# Patient Record
Sex: Female | Born: 1985 | Hispanic: Yes | Marital: Single | Smoking: Never smoker
Health system: Southern US, Community
[De-identification: ages and names within clinical notes are randomized; demographics above are authoritative.]

---

## 2018-07-22 ENCOUNTER — Other Ambulatory Visit: Payer: Self-pay

## 2018-07-22 ENCOUNTER — Encounter: Payer: Self-pay | Admitting: Emergency Medicine

## 2018-07-22 ENCOUNTER — Ambulatory Visit
Admission: EM | Admit: 2018-07-22 | Discharge: 2018-07-22 | Disposition: A | Discharge status: Home / Self Care | Payer: Self-pay | Attending: Family Medicine | Admitting: Family Medicine

## 2018-07-22 DIAGNOSIS — R109 Unspecified abdominal pain: Secondary | ICD-10-CM

## 2018-07-22 DIAGNOSIS — R350 Frequency of micturition: Secondary | ICD-10-CM

## 2018-07-22 DIAGNOSIS — R1011 Right upper quadrant pain: Secondary | ICD-10-CM

## 2018-07-22 DIAGNOSIS — R3915 Urgency of urination: Secondary | ICD-10-CM

## 2018-07-22 DIAGNOSIS — N12 Tubulo-interstitial nephritis, not specified as acute or chronic: Secondary | ICD-10-CM

## 2018-07-22 DIAGNOSIS — R1031 Right lower quadrant pain: Secondary | ICD-10-CM

## 2018-07-22 DIAGNOSIS — Z3202 Encounter for pregnancy test, result negative: Secondary | ICD-10-CM

## 2018-07-22 LAB — URINALYSIS, COMPLETE (UACMP) WITH MICROSCOPIC
Bilirubin Urine: NEGATIVE
GLUCOSE, UA: NEGATIVE mg/dL
KETONES UR: NEGATIVE mg/dL
Nitrite: NEGATIVE
PROTEIN: NEGATIVE mg/dL
Specific Gravity, Urine: 1.01 (ref 1.005–1.030)
WBC, UA: >50 WBC/hpf (ref 0–5)
pH: 6.5 (ref 5.0–8.0)

## 2018-07-22 LAB — CBC WITH DIFFERENTIAL/PLATELET
BASOS PCT: 0 %
Basophils Absolute: 0.1 10*3/uL (ref 0–0.1)
EOS ABS: 0 10*3/uL (ref 0–0.7)
EOS PCT: 0 %
HCT: 40.2 % (ref 35.0–47.0)
HEMOGLOBIN: 13.6 g/dL (ref 12.0–16.0)
Lymphocytes Relative: 8 %
Lymphs Abs: 1.8 10*3/uL (ref 1.0–3.6)
MCH: 29.9 pg (ref 26.0–34.0)
MCHC: 33.9 g/dL (ref 32.0–36.0)
MCV: 88 fL (ref 80.0–100.0)
MONO ABS: 1.2 10*3/uL — AB (ref 0.2–0.9)
MONOS PCT: 6 %
NEUTROS PCT: 86 %
Neutro Abs: 19.2 10*3/uL — ABNORMAL HIGH (ref 1.4–6.5)
Platelets: 319 10*3/uL (ref 150–440)
RBC: 4.56 MIL/uL (ref 3.80–5.20)
RDW: 12.6 % (ref 11.5–14.5)
WBC: 22.3 10*3/uL — ABNORMAL HIGH (ref 3.6–11.0)

## 2018-07-22 LAB — BASIC METABOLIC PANEL
Anion gap: 9 (ref 5–15)
BUN: 14 mg/dL (ref 6–20)
CALCIUM: 10.2 mg/dL (ref 8.9–10.3)
CO2: 21 mmol/L — ABNORMAL LOW (ref 22–32)
CREATININE: 0.86 mg/dL (ref 0.44–1.00)
Chloride: 103 mmol/L (ref 98–111)
GFR calc non Af Amer: >60 mL/min (ref >60)
Glucose, Bld: 128 mg/dL — ABNORMAL HIGH (ref 70–99)
Potassium: 3.3 mmol/L — ABNORMAL LOW (ref 3.5–5.1)
SODIUM: 133 mmol/L — AB (ref 135–145)

## 2018-07-22 LAB — PREGNANCY, URINE: Preg Test, Ur: NEGATIVE

## 2018-07-22 MED ORDER — CIPROFLOXACIN HCL 500 MG PO TABS: 500.0000 mg | ORAL_TABLET | Freq: Two times a day (BID) | ORAL | 0 refills | Status: AC

## 2018-07-22 MED ORDER — CEFTRIAXONE SODIUM 1 G IJ SOLR
1.0000 g | Freq: Once | INTRAMUSCULAR | Status: AC
  Administered 2018-07-22: 1 g via INTRAMUSCULAR

## 2018-07-22 NOTE — Discharge Instructions (Addendum)
Take medication as prescribed. Rest. Drink plenty of fluids.   Follow up with your primary care physician this week. Return to Urgent care as needed.  For any increased pain, inability to tolerate food or medicines or worsening concerns go directly to the emergency room as discussed.

## 2018-07-22 NOTE — ED Provider Notes (Signed)
[facility] ____________________________________________  Time seen: Approximately 9:30 PM  I have reviewed the triage vital signs and the nursing notes.   HISTORY  Chief Complaint Fever and Abdominal Pain  Language Barrier: Spanish. Interpreter offered, patient declined and opted to use friend at bedside.   HPI Doris Li is a 32 y.o. female presenting with friend at bedside for evaluation of 6 days of right flank pain that has gradually increased.  Reports 6 days of accompanying urinary frequency and some urinary urgency with darker appearing urine.  States since yesterday she has felt like she has developed a fever with accompanying chills and body aches.  Some nausea last night and one episode of diarrhea today.  No other changes in bowel habits.  No vomiting.  Has not eaten today but continues to drink fluids well.  Pain currently moderate.  Has been taken Tylenol and ibuprofen today with last taken a few hours ago.  Denies any vaginal complaints.  Also today with some front lower abdominal pain.  Denies history of previous abdominal issues, abdominal surgeries or urinary infections.  Denies concerns of current pregnancy.  Denies recent sickness.  Denies other aggravating or alleviating factors.  Reports otherwise feels well. Denies chest pain, shortness of breath, or rash. Denies recent sickness. Denies recent antibiotic use.   Patient's last menstrual period was 07/08/2018 (approximate).denies pregnancy.    History reviewed. No pertinent past medical history. Denies   There are no active problems to display for this patient.   History reviewed. No pertinent surgical history.   No current facility-administered medications for this encounter.   Current Outpatient Medications:  .  ciprofloxacin (CIPRO) 500 MG tablet, Take 1 tablet (500 mg total) by mouth 2 (two) times daily for 7 days., Disp: 14 tablet, Rfl: 0  Allergies Patient has no known allergies.  Family  History  Problem Relation Age of Onset  . Cancer Mother     Social History Social History   Tobacco Use  . Smoking status: Never Smoker  . Smokeless tobacco: Never Used  Substance Use Topics  . Alcohol use: Never    Frequency: Never  . Drug use: Never    Review of Systems Constitutional: positive fever. ENT: No sore throat. Cardiovascular: Denies chest pain. Respiratory: Denies shortness of breath. Gastrointestinal: as above.  Genitourinary: positive for dysuria. Musculoskeletal: Negative for back pain. Skin: Negative for rash.   ____________________________________________   PHYSICAL EXAM:  VITAL SIGNS: ED Triage Vitals  Enc Vitals Group     BP 07/22/18 2016 111/68     Pulse Rate 07/22/18 2016 (!) 103     Resp 07/22/18 2016 18     Temp 07/22/18 2016 99.6 F (37.6 C)     Temp Source 07/22/18 2016 Oral     SpO2 07/22/18 2016 98 %     Weight 07/22/18 2013 167 lb (75.8 kg)     Height 07/22/18 2013 5\' 5"  (1.651 m)     Head Circumference --      Peak Flow --      Pain Score 07/22/18 2012 10     Pain Loc --      Pain Edu? --      Excl. in GC? --    Vitals:   07/22/18 2013 07/22/18 2016 07/22/18 2103  BP:  111/68 111/68  Pulse:  (!) 103 94  Resp:  18 18  Temp:  99.6 F (37.6 C) 99.5 F (37.5 C)  TempSrc:  Oral Oral  SpO2:  98% 98%  Weight: 167 lb (75.8 kg)    Height: 5\' 5"  (1.651 m)       Constitutional: Alert and oriented.  Appears in mild distress but sitting still and calmly. Eyes: Conjunctivae are normal.  ENT      Head: Normocephalic and atraumatic.      Nose: No congestion/rhinnorhea.      Mouth/Throat: Mucous membranes are moist.Oropharynx non-erythematous.  No tonsillar swelling or exudate. Neck: No stridor. Supple without meningismus.  Hematological/Lymphatic/Immunilogical: No cervical lymphadenopathy. Cardiovascular: Normal rate, regular rhythm. Grossly normal heart sounds.  Good peripheral circulation. Respiratory: Normal respiratory  effort without tachypnea nor retractions. Breath sounds are clear and equal bilaterally. No wheezes, rales, rhonchi. Gastrointestinal: Normal bowel sounds.  No distention.  Moderate right CVA tenderness, no left CVA tenderness.  Mild to moderate right upper and right lower quadrant abdominal tenderness, abdomen otherwise soft and nontender. Musculoskeletal:  No midline cervical, thoracic or lumbar tenderness to palpation.  Neurologic:  Normal speech and language. Speech is normal. No gait instability.  Skin:  Skin is warm, dry and intact. No rash noted. Psychiatric: Mood and affect are normal. Speech and behavior are normal. Patient exhibits appropriate insight and judgment   ___________________________________________   LABS (all labs ordered are listed, but only abnormal results are displayed)  Labs Reviewed  URINALYSIS, COMPLETE (UACMP) WITH MICROSCOPIC - Abnormal; Notable for the following components:      Result Value   APPearance CLOUDY (*)    Hgb urine dipstick TRACE (*)    Leukocytes, UA MODERATE (*)    Bacteria, UA MANY (*)    All other components within normal limits  CBC WITH DIFFERENTIAL/PLATELET - Abnormal; Notable for the following components:   WBC 22.3 (*)    Neutro Abs 19.2 (*)    Monocytes Absolute 1.2 (*)    All other components within normal limits  BASIC METABOLIC PANEL - Abnormal; Notable for the following components:   Sodium 133 (*)    Potassium 3.3 (*)    CO2 21 (*)    Glucose, Bld 128 (*)    All other components within normal limits  URINE CULTURE  PREGNANCY, URINE   ____________________________________________  RADIOLOGY  No results found. ____________________________________________   PROCEDURES Procedures     INITIAL IMPRESSION / ASSESSMENT AND PLAN / ED COURSE  Pertinent labs & imaging results that were available during my care of the patient were reviewed by me and considered in my medical decision making (see chart for  details).  Patient presenting for evaluation of right flank pain and dysuria that continually increased over the last 6 days.  Patient does have moderate right CVA tenderness and mild to moderate right-sided abdominal tenderness with noted temperature.  Discussed multiple differentials with patient including appendicitis, urinary tract infection, pyelonephritis and ovarian source.  Urinalysis as above, suspect pyelonephritis.  Patient continues to tolerate food and fluids.  Labs reviewed and discussed in detail with patient.  Also discussed potassium 3.3 offered replacement, patient denies and states that she will alter diet.  Discussed current treatment including ER evaluation to differentiate between pyelonephritis and appendicitis, patient declined going to ER at this time and prefers outpatient initial treatment.  1 g IM Rocephin given in urgent care.  Will treat with 1 week of Cipro.  Encourage 2 to 3-day PCP follow-up.  Discussed for any increase of pain, difficulty controlling fever or inability to tolerate food or fluid or medication or worsening concerns go directly to the ER, and patient agreed  to this plan.Discussed indication, risks and benefits of medications with patient.  Discussed follow up and return parameters including no resolution or any worsening concerns. Patient verbalized understanding and agreed to plan.   ____________________________________________   FINAL CLINICAL IMPRESSION(S) / ED DIAGNOSES  Final diagnoses:  Pyelonephritis  Right flank pain     ED Discharge Orders         Ordered    ciprofloxacin (CIPRO) 500 MG tablet  2 times daily     07/22/18 2115           Note: This dictation was prepared with Dragon dictation along with smaller phrase technology. Any transcriptional errors that result from this process are unintentional.         Renford Dills, NP 07/22/18 2140

## 2018-07-22 NOTE — ED Triage Notes (Signed)
Patient c/o fever that started yesterday and right side abdominal pain that started 1 week ago.

## 2018-07-25 LAB — URINE CULTURE: Culture: >=100000 — AB

## 2018-08-20 ENCOUNTER — Other Ambulatory Visit: Payer: Self-pay

## 2018-08-20 ENCOUNTER — Encounter: Payer: Self-pay | Admitting: Emergency Medicine

## 2018-08-20 ENCOUNTER — Ambulatory Visit (INDEPENDENT_AMBULATORY_CARE_PROVIDER_SITE_OTHER): Payer: Self-pay

## 2018-08-20 ENCOUNTER — Ambulatory Visit
Admission: EM | Admit: 2018-08-20 | Discharge: 2018-08-20 | Disposition: A | Discharge status: Home / Self Care | Payer: Self-pay | Attending: Emergency Medicine | Admitting: Emergency Medicine

## 2018-08-20 ENCOUNTER — Emergency Department
Admission: EM | Admit: 2018-08-20 | Discharge: 2018-08-20 | Disposition: A | Discharge status: Home / Self Care | Payer: Self-pay | Attending: Emergency Medicine | Admitting: Emergency Medicine

## 2018-08-20 DIAGNOSIS — Z3202 Encounter for pregnancy test, result negative: Secondary | ICD-10-CM

## 2018-08-20 DIAGNOSIS — N201 Calculus of ureter: Secondary | ICD-10-CM

## 2018-08-20 DIAGNOSIS — N132 Hydronephrosis with renal and ureteral calculous obstruction: Secondary | ICD-10-CM

## 2018-08-20 DIAGNOSIS — N23 Unspecified renal colic: Secondary | ICD-10-CM | POA: Insufficient documentation

## 2018-08-20 LAB — URINALYSIS, COMPLETE (UACMP) WITH MICROSCOPIC
BILIRUBIN URINE: NEGATIVE
GLUCOSE, UA: NEGATIVE mg/dL
Ketones, ur: NEGATIVE mg/dL
NITRITE: NEGATIVE
Protein, ur: NEGATIVE mg/dL
Specific Gravity, Urine: 1.015 (ref 1.005–1.030)
pH: 6 (ref 5.0–8.0)

## 2018-08-20 LAB — CBC WITH DIFFERENTIAL/PLATELET
BASOS ABS: 0 10*3/uL (ref 0–0.1)
Basophils Relative: 0 %
EOS ABS: 0.1 10*3/uL (ref 0–0.7)
EOS PCT: 1 %
HCT: 37.6 % (ref 35.0–47.0)
HEMOGLOBIN: 12.9 g/dL (ref 12.0–16.0)
LYMPHS ABS: 2.4 10*3/uL (ref 1.0–3.6)
LYMPHS PCT: 25 %
MCH: 30 pg (ref 26.0–34.0)
MCHC: 34.4 g/dL (ref 32.0–36.0)
MCV: 87.2 fL (ref 80.0–100.0)
Monocytes Absolute: 0.8 10*3/uL (ref 0.2–0.9)
Monocytes Relative: 8 %
NEUTROS PCT: 66 %
Neutro Abs: 6.3 10*3/uL (ref 1.4–6.5)
PLATELETS: 277 10*3/uL (ref 150–440)
RBC: 4.31 MIL/uL (ref 3.80–5.20)
RDW: 13 % (ref 11.5–14.5)
WBC: 9.7 10*3/uL (ref 3.6–11.0)

## 2018-08-20 LAB — CBC
HEMATOCRIT: 37.5 % (ref 35.0–47.0)
HEMOGLOBIN: 13.4 g/dL (ref 12.0–16.0)
MCH: 30.9 pg (ref 26.0–34.0)
MCHC: 35.7 g/dL (ref 32.0–36.0)
MCV: 86.6 fL (ref 80.0–100.0)
Platelets: 288 10*3/uL (ref 150–440)
RBC: 4.34 MIL/uL (ref 3.80–5.20)
RDW: 13.1 % (ref 11.5–14.5)
WBC: 10.6 10*3/uL (ref 3.6–11.0)

## 2018-08-20 LAB — BASIC METABOLIC PANEL
ANION GAP: 7 (ref 5–15)
BUN: 18 mg/dL (ref 6–20)
CHLORIDE: 105 mmol/L (ref 98–111)
CO2: 23 mmol/L (ref 22–32)
Calcium: 9.7 mg/dL (ref 8.9–10.3)
Creatinine, Ser: 1.4 mg/dL — ABNORMAL HIGH (ref 0.44–1.00)
GFR, EST AFRICAN AMERICAN: 58 mL/min — AB (ref >60)
GFR, EST NON AFRICAN AMERICAN: 50 mL/min — AB (ref >60)
Glucose, Bld: 96 mg/dL (ref 70–99)
POTASSIUM: 3.5 mmol/L (ref 3.5–5.1)
SODIUM: 135 mmol/L (ref 135–145)

## 2018-08-20 LAB — PREGNANCY, URINE: PREG TEST UR: NEGATIVE

## 2018-08-20 MED ORDER — OXYCODONE-ACETAMINOPHEN 5-325 MG PO TABS: 1.0000 | ORAL_TABLET | ORAL | 0 refills | Status: AC | PRN

## 2018-08-20 MED ORDER — KETOROLAC TROMETHAMINE 60 MG/2ML IM SOLN
30.0000 mg | Freq: Once | INTRAMUSCULAR | Status: AC
  Administered 2018-08-20: 30 mg via INTRAMUSCULAR

## 2018-08-20 MED ORDER — ONDANSETRON HCL 4 MG/2ML IJ SOLN
4.0000 mg | Freq: Once | INTRAMUSCULAR | Status: AC
  Administered 2018-08-20: 4 mg via INTRAVENOUS
  Filled 2018-08-20: qty 2

## 2018-08-20 MED ORDER — HYDROMORPHONE HCL 1 MG/ML IJ SOLN
0.5000 mg | Freq: Once | INTRAMUSCULAR | Status: AC
  Administered 2018-08-20: 0.5 mg via INTRAVENOUS
  Filled 2018-08-20: qty 1

## 2018-08-20 NOTE — ED Provider Notes (Signed)
[facility] Emergency Department Provider Note   ____________________________________________   First MD Initiated Contact with Patient 08/20/18 1701     (approximate)  I have reviewed the triage vital signs and the nursing notes.   HISTORY  Chief Complaint Flank Pain    HPI Doris Li is a 32 y.o. female patient comes in complaining of right-sided flank pain rating down toward the groin.  She reports she is had this on the left before and it was a kidney stone.  Lab work and CT done in urgent care show a very large kidney stone in the distal ureter on the right side.  This explains her symptoms.  She is not running a fever.  She does not have white cells in her urine.  Pain is much better controlled by Toradol beginning to come back.  I discussed her with Dr. Sheppard Penton who is on-call for urology.  In view of her not having any pain he will follow her up in the office.  We will give her some Percocet for pain control.  She will return for any further problems.   History reviewed. No pertinent past medical history.  There are no active problems to display for this patient.   History reviewed. No pertinent surgical history.  Prior to Admission medications   Not on File    Allergies Patient has no known allergies.  Family History  Problem Relation Age of Onset  . Breast cancer Mother   . Healthy Father     Social History Social History   Tobacco Use  . Smoking status: Never Smoker  . Smokeless tobacco: Never Used  Substance Use Topics  . Alcohol use: Never    Frequency: Never  . Drug use: Never    Review of Systems  Constitutional: No fever/chills Eyes: No visual changes. ENT: No sore throat. Cardiovascular: Denies chest pain. Respiratory: Denies shortness of breath. Gastrointestinal: No abdominal pain at present.  No nausea, no vomiting.  No diarrhea.  No constipation. Genitourinary: Negative for dysuria at  present. Musculoskeletal: Negative for back pain. Skin: Negative for rash. Neurological: Negative for headaches, focal weakness  ____________________________________________   PHYSICAL EXAM:  VITAL SIGNS: ED Triage Vitals [08/20/18 1320]  Enc Vitals Group     BP 116/65     Pulse Rate 73     Resp 17     Temp 98.5 F (36.9 C)     Temp Source Oral     SpO2 98 %     Weight 164 lb (74.4 kg)     Height 5\' 5"  (1.651 m)     Head Circumference      Peak Flow      Pain Score 2     Pain Loc      Pain Edu?      Excl. in GC?     Constitutional: Alert and oriented. Well appearing and in no acute distress. Eyes: Conjunctivae are normal. PERRL. EOMI. Head: Atraumatic. Nose: No congestion/rhinnorhea. Mouth/Throat: Mucous membranes are moist.  Oropharynx non-erythematous. Neck: No stridor. Cardiovascular: Normal rate, regular rhythm. Grossly normal heart sounds.  Good peripheral circulation. Respiratory: Normal respiratory effort.  No retractions. Lungs CTAB. Gastrointestinal: Soft mild tenderness on the right side.  No distention. No abdominal bruits.  Some right CVA tenderness. Musculoskeletal: No lower extremity tenderness nor edema.  No joint effusions. Neurologic:  Normal speech and language. No gross focal neurologic deficits are appreciated. No gait instability. Skin:  Skin is warm, dry and intact.  No rash noted. Psychiatric: Mood and affect are normal. Speech and behavior are normal.  ____________________________________________   LABS (all labs ordered are listed, but only abnormal results are displayed)  Labs Reviewed  BASIC METABOLIC PANEL - Abnormal; Notable for the following components:      Result Value   Creatinine, Ser 1.40 (*)    GFR calc non Af Amer 50 (*)    GFR calc Af Amer 58 (*)    All other components within normal limits  CBC   ____________________________________________  EKG   ____________________________________________  RADIOLOGY   ED MD  interpretation: CT done at urgent care shows a very large right distal ureteral stone  Official radiology report(s): Ct Renal Stone Study  Result Date: 08/20/2018 CLINICAL DATA:  Right-sided flank pain for 1 day, initial encounter EXAM: CT ABDOMEN AND PELVIS WITHOUT CONTRAST TECHNIQUE: Multidetector CT imaging of the abdomen and pelvis was performed following the standard protocol without IV contrast. COMPARISON:  None. FINDINGS: Lower chest: No acute abnormality. Hepatobiliary: No focal liver abnormality is seen. No gallstones, gallbladder wall thickening, or biliary dilatation. Pancreas: Unremarkable. No pancreatic ductal dilatation or surrounding inflammatory changes. Spleen: Normal in size without focal abnormality. Adrenals/Urinary Tract: Adrenal glands are within normal limits. The left kidney demonstrates no renal calculi or obstructive changes. The bladder is decompressed. The right kidney demonstrates significant enlargement with massive hydronephrosis and proximal hydroureter. A few tiny nonobstructing right renal stones are noted. The obstructive changes are secondary to a large proximal right ureteral calculus measuring approximately 13 x 9 mm. The more distal right ureter is within normal limits. Stomach/Bowel: Stomach is within normal limits. Appendix appears normal. No evidence of bowel wall thickening, distention, or inflammatory changes. Vascular/Lymphatic: No significant vascular findings are present. No enlarged abdominal or pelvic lymph nodes. Reproductive: Uterus and bilateral adnexa are unremarkable. Other: No abdominal wall hernia or abnormality. No abdominopelvic ascites. Musculoskeletal: No acute or significant osseous findings. IMPRESSION: 13 x 9 mm obstructive stone within the proximal right ureter with significant hydronephrotic changes. Tiny nonobstructing renal stones are noted in the right kidney. No other focal abnormality is noted. These results were called by telephone at the  time of interpretation on 08/20/2018 at 12:08 pm to Dr. Domenick Gong , who verbally acknowledged these results. Electronically Signed   By: Alcide Clever M.D.   On: 08/20/2018 12:08    ____________________________________________   PROCEDURES  Procedure(s) performed:   Procedures  Critical Care performed:   ____________________________________________   INITIAL IMPRESSION / ASSESSMENT AND PLAN / ED COURSE  We will give the patient Percocet to go home as I described above patient will follow-up with Dr. Sheppard Penton in the office she will come back here for worse pain fever vomiting.         ____________________________________________   FINAL CLINICAL IMPRESSION(S) / ED DIAGNOSES  Final diagnoses:  None     ED Discharge Orders    None       Note:  This document was prepared using Dragon voice recognition software and may include unintentional dictation errors.    Arnaldo Natal, MD 08/20/18 [phone]

## 2018-08-20 NOTE — ED Triage Notes (Signed)
Patient in today c/o right sided flank pain, dysuria, urinary frequency since the early hours of this morning and emesis x 1 today. Patient denies fever.

## 2018-08-20 NOTE — ED Notes (Signed)
Spanish interpretor  In

## 2018-08-20 NOTE — ED Triage Notes (Signed)
Pt comes into the ED via POV c/o right flank pain that started today.  Patient has had N/V associated with the pain.  H/o kidney stones that was found at [facility] urgent care and they sent her here.  Patient in NAD at this time with even and unlabored respirations.

## 2018-08-20 NOTE — ED Provider Notes (Signed)
HPI  SUBJECTIVE:  Doris Li is a 32 y.o. female who presents with lateral right back pain that started today.  She describes the pain as hot, burning, constant. It Is not affected with movement, bending forward.  States that it radiates down her right flank.  It does not migrate.  She reports dysuria, one episode of nausea and vomiting today.  No recent weight lifting, trauma to the back.  No urinary urgency, frequency, cloudy or odorous urine, hematuria, body aches.  No other abdominal pain.  No fevers.  She took a thousand milligrams of Tylenol within 6 hours of evaluation and states that it helped flank pain but not the back pain.  There are no other aggravating or alleviating factors.  Past medical history of pyelonephritis, E. coli UTI, obstructing nephrolithiasis 3 years ago.  She states that she is unable to tell if this feels like a kidney stone or pyelonephritis.  No history of diabetes, hypertension.  LMP: 8/20.  PMD: None.  She is Spanish-speaking only.  Offered to get the language line, but patient declined.  She has a friend here acting as an Equities trader.  Patient was seen here back in August for right flank pain with frequency, urgency, chills and feeling feverish.  She was thought to have pyelonephritis, given 1 g of Rocephin and was sent home with 1 week of Cipro.  Urine culture grew out an E. coli UTI that was pansensitive.  History reviewed. No pertinent past medical history.  History reviewed. No pertinent surgical history.  Family History  Problem Relation Age of Onset  . Breast cancer Mother   . Healthy Father     Social History   Tobacco Use  . Smoking status: Never Smoker  . Smokeless tobacco: Never Used  Substance Use Topics  . Alcohol use: Never    Frequency: Never  . Drug use: Never    No current facility-administered medications for this encounter.  No current outpatient medications on file.  No Known Allergies   ROS  As noted in HPI.    Physical Exam  BP 129/88 (BP Location: Left Arm)   Pulse 73   Temp 98.2 F (36.8 C) (Oral)   Resp 16   Ht 5\' 5"  (1.651 m)   Wt 74.4 kg   LMP 07/15/2018 (Approximate)   SpO2 99%   BMI 27.29 kg/m   Constitutional: Well developed, well nourished, no acute distress Eyes:  EOMI, conjunctiva normal bilaterally HENT: Normocephalic, atraumatic,mucus membranes moist Respiratory: Normal inspiratory effort Cardiovascular: Normal rate GI: nondistended, active bowel sounds.  Soft.  Positive suprapubic, right flank tenderness.  No right lower quadrant tenderness.  Guarding, rebound. Back: Positive right CVAT.  Mild paralumbar tenderness right side.  No lumbar tenderness.  Pain slightly aggravated with right hip flexion against resistance.  No lumbar bony tenderness.  EHL, flexion extension at knees, hips, strength 5/5 and equal bilaterally.  Sensation between thighs grossly intact.  PT 2+.  Knee jerk 2+ and equal bilaterally. skin: No rash, skin intact Musculoskeletal: no deformities Neurologic: Alert & oriented x 3, no focal neuro deficits Psychiatric: Speech and behavior appropriate   ED Course   Medications  ketorolac (TORADOL) injection 30 mg (30 mg Intramuscular Given 08/20/18 1111)    Orders Placed This Encounter  Procedures  . Urine culture    Standing Status:   Standing    Number of Occurrences:   1  . CT Renal Stone Study    Standing Status:   Standing  Number of Occurrences:   1    Order Specific Question:   Is patient pregnant?    Answer:   No    Order Specific Question:   Radiology Contrast Protocol - do NOT remove file path    Answer:   [email or web address]    Order Specific Question:   ** REASON FOR EXAM (FREE TEXT)    Answer:   h/o obstructing stone, has R CVAT, R flank tenderness r/o recurrent stone  . Urinalysis, Complete w Microscopic    Standing Status:   Standing    Number of Occurrences:   1  . Pregnancy, urine    Standing  Status:   Standing    Number of Occurrences:   1  . CBC with Differential    Standing Status:   Standing    Number of Occurrences:   1    Results for orders placed or performed during the hospital encounter of 08/20/18 (from the past 24 hour(s))  Urinalysis, Complete w Microscopic     Status: Abnormal   Collection Time: 08/20/18 10:33 AM  Result Value Ref Range   Color, Urine YELLOW YELLOW   APPearance HAZY (A) CLEAR   Specific Gravity, Urine 1.015 1.005 - 1.030   pH 6.0 5.0 - 8.0   Glucose, UA NEGATIVE NEGATIVE mg/dL   Hgb urine dipstick MODERATE (A) NEGATIVE   Bilirubin Urine NEGATIVE NEGATIVE   Ketones, ur NEGATIVE NEGATIVE mg/dL   Protein, ur NEGATIVE NEGATIVE mg/dL   Nitrite NEGATIVE NEGATIVE   Leukocytes, UA TRACE (A) NEGATIVE   Squamous Epithelial / LPF 0-5 0 - 5   WBC, UA 0-5 0 - 5 WBC/hpf   RBC / HPF 21-50 0 - 5 RBC/hpf   Bacteria, UA RARE (A) NONE SEEN   Ca Oxalate Crys, UA PRESENT   Pregnancy, urine     Status: None   Collection Time: 08/20/18 10:33 AM  Result Value Ref Range   Preg Test, Ur NEGATIVE NEGATIVE  CBC with Differential     Status: None   Collection Time: 08/20/18 11:13 AM  Result Value Ref Range   WBC 9.7 3.6 - 11.0 K/uL   RBC 4.31 3.80 - 5.20 MIL/uL   Hemoglobin 12.9 12.0 - 16.0 g/dL   HCT 40.9 81.1 - 91.4 %   MCV 87.2 80.0 - 100.0 fL   MCH 30.0 26.0 - 34.0 pg   MCHC 34.4 32.0 - 36.0 g/dL   RDW 78.2 95.6 - 21.3 %   Platelets 277 150 - 440 K/uL   Neutrophils Relative % 66 %   Neutro Abs 6.3 1.4 - 6.5 K/uL   Lymphocytes Relative 25 %   Lymphs Abs 2.4 1.0 - 3.6 K/uL   Monocytes Relative 8 %   Monocytes Absolute 0.8 0.2 - 0.9 K/uL   Eosinophils Relative 1 %   Eosinophils Absolute 0.1 0 - 0.7 K/uL   Basophils Relative 0 %   Basophils Absolute 0.0 0 - 0.1 K/uL   Ct Renal Stone Study  Result Date: 08/20/2018 CLINICAL DATA:  Right-sided flank pain for 1 day, initial encounter EXAM: CT ABDOMEN AND PELVIS WITHOUT CONTRAST TECHNIQUE: Multidetector  CT imaging of the abdomen and pelvis was performed following the standard protocol without IV contrast. COMPARISON:  None. FINDINGS: Lower chest: No acute abnormality. Hepatobiliary: No focal liver abnormality is seen. No gallstones, gallbladder wall thickening, or biliary dilatation. Pancreas: Unremarkable. No pancreatic ductal dilatation or surrounding inflammatory changes. Spleen: Normal in size without focal abnormality. Adrenals/Urinary Tract: Adrenal glands  are within normal limits. The left kidney demonstrates no renal calculi or obstructive changes. The bladder is decompressed. The right kidney demonstrates significant enlargement with massive hydronephrosis and proximal hydroureter. A few tiny nonobstructing right renal stones are noted. The obstructive changes are secondary to a large proximal right ureteral calculus measuring approximately 13 x 9 mm. The more distal right ureter is within normal limits. Stomach/Bowel: Stomach is within normal limits. Appendix appears normal. No evidence of bowel wall thickening, distention, or inflammatory changes. Vascular/Lymphatic: No significant vascular findings are present. No enlarged abdominal or pelvic lymph nodes. Reproductive: Uterus and bilateral adnexa are unremarkable. Other: No abdominal wall hernia or abnormality. No abdominopelvic ascites. Musculoskeletal: No acute or significant osseous findings. IMPRESSION: 13 x 9 mm obstructive stone within the proximal right ureter with significant hydronephrotic changes. Tiny nonobstructing renal stones are noted in the right kidney. No other focal abnormality is noted. These results were called by telephone at the time of interpretation on 08/20/2018 at 12:08 pm to Dr. Domenick Gong , who verbally acknowledged these results. Electronically Signed   By: Alcide Clever M.D.   On: 08/20/2018 12:08    ED Clinical Impression  Ureterolithiasis  Hydronephrosis with urinary obstruction due to renal calculus   ED  Assessment/Plan  Previous records reviewed.  As noted in HPI.  UA, urine pregnancy, CBC.  Renal stone CT because of history of obstructing stones to rule out recurrent obstructing stone.  Does not appear to be appendicitis.  Muscular skeletal low back pain also in the differential, but given the urinary complaints, suprapubic and flank tenderness, more concern for stone or infection.  Toradol 30 mg IM x1.  Reviewed imaging independently.  Large right-sided renal stone measuring 13 x 9 mm with significant hydronephrosis.  Discussed this with radiologist.  See radiology report for full details.  On repeat evaluation, patient states that she feels better.  Sending urine off for culture.  Trace esterase, few bacteria, positive calcium oxalate crystals, moderate blood.  Transferring to the ED for urology evaluation.  Feel that the patient is stable to go by private vehicle. Notified the ED.   Discussed labs, imaging, MDM, treatment plan, and plan for follow-up with patient and friend who is acting as Nurse, learning disability. patient agrees with plan.   Meds ordered this encounter  Medications  . ketorolac (TORADOL) injection 30 mg    *This clinic note was created using Scientist, clinical (histocompatibility and immunogenetics). Therefore, there may be occasional mistakes despite careful proofreading.   ?   Domenick Gong, MD 08/20/18 1340

## 2018-08-20 NOTE — ED Notes (Signed)
Spanish interpretor  In  With patient  To explain

## 2018-08-20 NOTE — Discharge Instructions (Addendum)
Go immediately to the [facility] ER. Do not have anything to eat or drink until your evaluation is complete.

## 2018-08-20 NOTE — ED Notes (Signed)
Urine given to [facility] today.  Results are back. Negative POC preg at urgent care as well.

## 2018-08-20 NOTE — Discharge Instructions (Signed)
Please give the urologist to call in the morning.  Take the Percocet 1 or 2 pills 4 times a day if needed for pain.  Return here for fever vomiting or pain is not controlled by the medicines.

## 2018-08-21 ENCOUNTER — Encounter: Payer: Self-pay | Admitting: Urology

## 2018-08-21 ENCOUNTER — Ambulatory Visit (INDEPENDENT_AMBULATORY_CARE_PROVIDER_SITE_OTHER): Payer: Self-pay | Admitting: Urology

## 2018-08-21 ENCOUNTER — Telehealth: Payer: Self-pay | Admitting: Urology

## 2018-08-21 VITALS — BP 122/75 | HR 67 | Resp 16 | Ht 65.0 in | Wt 173.7 lb

## 2018-08-21 DIAGNOSIS — N201 Calculus of ureter: Secondary | ICD-10-CM

## 2018-08-21 LAB — URINE CULTURE: Culture: NO GROWTH

## 2018-08-21 NOTE — Progress Notes (Addendum)
08/21/2018 3:23 PM   Kionna Nolasco-Martinez [birth year] [id number]  CC: Right proximal ureteral stone  HPI: I had the pleasure of meeting Ms. Nolasco-Martinez in urology clinic today.  Today's visit was conducted via a Engineer, structural.  She is a healthy 32 year old female who was seen last night in the emergency department with 24 hours of right-sided colicky flank pain and a 1.3 cm right proximal ureteral stone.  She is afebrile and her urine last night was not infected.  Her pain is currently well controlled. She was treated for an E. coli UTI in August 2019.  She also has a history of previous stones, and states she was treated with ureteroscopy on the left side in [place] 3 years ago.  She has not had any prior abdominal surgeries.   PMH: History reviewed. No pertinent past medical history.  Surgical History: History reviewed. No pertinent surgical history.  Allergies: No Known Allergies  Family History: Family History  Problem Relation Age of Onset  . Breast cancer Mother   . Healthy Father     Social History:  reports that she has never smoked. She has never used smokeless tobacco. She reports that she does not drink alcohol or use drugs.  ROS: Please see flowsheet from today's date for complete review of systems.  Physical Exam: BP 122/75 (BP Location: Left Arm, Patient Position: Sitting, Cuff Size: Normal)   Pulse 67   Resp 16   Ht 5\' 5"  (1.651 m)   Wt 173 lb 11.2 oz (78.8 kg)   BMI 28.91 kg/m    Constitutional:  Alert and oriented, No acute distress. Cardiovascular: No clubbing, cyanosis, or edema. Respiratory: Normal respiratory effort, no increased work of breathing. GI: Abdomen is soft, nontender, nondistended, no abdominal masses GU: Mild right CVA tenderness Lymph: No cervical or inguinal lymphadenopathy. Skin: No rashes, bruises or suspicious lesions. Neurologic: Grossly intact, no focal deficits, moving all 4 extremities. Psychiatric: Normal mood  and affect.  Laboratory Data: Urinalysis 08/20/2018: 0-5 WBCs, 21-50 RBCs, rare bacteria sCr 1.4 WBC 10.6 Calcium 9.7  Pertinent Imaging: I have personally reviewed the CT renal stone study dated 08/20/2018: There is a right 1.3 cm proximal ureteral stone with upstream hydronephrosis.    850 HU, visible on scout CT, 12 cm skin to stone distance.  Assessment & Plan:   In summary, the patient is a healthy 32 year old female with a non-infected 1.3 cm right proximal ureteral stone.   We discussed various treatment options for urolithiasis including observation with or without medical expulsive therapy, shockwave lithotripsy (SWL), ureteroscopy and laser lithotripsy with stent placement, and percutaneous nephrolithotomy.  We discussed that management is based on stone size, location, density, patient co-morbidities, and patient preference.   I discussed that her stone is extremely unlikely to pass spontaneously secondary to its large size.  SWL has a lower stone free rate in a single procedure, but also a lower complication rate compared to ureteroscopy and avoids a stent and associated stent related symptoms. Possible complications include renal hematoma, steinstrasse, and need for additional treatment.  Ureteroscopy with laser lithotripsy and stent placement has a higher stone free rate than SWL in a single procedure, however increased complication rate including possible infection, ureteral injury, bleeding, and stent related morbidity. Common stent related symptoms include dysuria, urgency/frequency, and flank pain.  After an extensive discussion of the risks and benefits of the above treatment options, the patient would like to proceed with RIGHT SWL. We will try to add her on for  this Thursday, October 3.   Sondra Come, MD  [facility] [address] [place], [place] [address] [phone]

## 2018-08-21 NOTE — Telephone Encounter (Signed)
-----   Message from Sondra Come, MD sent at 08/20/2018  6:06 PM EDT ----- Regarding: add on clinic patient Please add her on to my clinic for Friday 9/27 at 1:30pm. She has a large stone and was seen in ER 9/26. Thanks  Legrand Rams, MD 08/20/2018

## 2018-08-21 NOTE — Telephone Encounter (Signed)
App made patient aware  MB

## 2018-08-24 ENCOUNTER — Ambulatory Visit: Payer: Self-pay | Admitting: Urology

## 2018-08-24 ENCOUNTER — Other Ambulatory Visit: Payer: Self-pay | Admitting: Radiology

## 2018-08-24 ENCOUNTER — Telehealth: Payer: Self-pay | Admitting: Radiology

## 2018-08-24 DIAGNOSIS — N201 Calculus of ureter: Secondary | ICD-10-CM

## 2018-08-24 NOTE — Telephone Encounter (Signed)
Discuss shockwave lithotripsy with patient using [facility] Doris Li [phone] Instructions given. Questions answered. Patient voices understanding.

## 2018-08-24 NOTE — Telephone Encounter (Signed)
-----   Message from Sondra Come, MD sent at 08/24/2018  1:23 PM EDT ----- Regarding: SWL this Thursday add on This is a patient I saw in clinic Friday with a 1.3cm ureteral stone and pain. I believe she had some insurance questions/issues on Friday afternoon, could you reach out to her and try to schedule SWL for this Thursday? Thanks. She is spanish speaking and needs translator.  Legrand Rams, MD 08/24/2018

## 2018-08-26 MED ORDER — CIPROFLOXACIN HCL 500 MG PO TABS
500.0000 mg | ORAL_TABLET | ORAL | Status: AC
  Administered 2018-08-27: 500 mg via ORAL

## 2018-08-27 ENCOUNTER — Ambulatory Visit: Payer: Self-pay

## 2018-08-27 ENCOUNTER — Other Ambulatory Visit: Payer: Self-pay

## 2018-08-27 ENCOUNTER — Encounter
Admission: RE | Disposition: A | Discharge status: Home / Self Care | Payer: Self-pay | Source: Ambulatory Visit | Attending: Urology

## 2018-08-27 ENCOUNTER — Ambulatory Visit
Admission: RE | Admit: 2018-08-27 | Discharge: 2018-08-27 | Disposition: A | Discharge status: Home / Self Care | Payer: Self-pay | Source: Ambulatory Visit | Attending: Urology | Admitting: Urology

## 2018-08-27 ENCOUNTER — Encounter: Payer: Self-pay | Admitting: *Deleted

## 2018-08-27 DIAGNOSIS — N201 Calculus of ureter: Secondary | ICD-10-CM | POA: Insufficient documentation

## 2018-08-27 HISTORY — PX: EXTRACORPOREAL SHOCK WAVE LITHOTRIPSY: SHX1557

## 2018-08-27 LAB — POCT PREGNANCY, URINE: PREG TEST UR: NEGATIVE

## 2018-08-27 SURGERY — LITHOTRIPSY, ESWL
Anesthesia: Moderate Sedation | Laterality: Right

## 2018-08-27 MED ORDER — ONDANSETRON HCL 4 MG/2ML IJ SOLN
4.0000 mg | Freq: Once | INTRAMUSCULAR | Status: AC | PRN
  Administered 2018-08-27: 4 mg via INTRAVENOUS

## 2018-08-27 MED ORDER — SODIUM CHLORIDE 0.9 % IV SOLN
INTRAVENOUS | Status: DC
  Administered 2018-08-27: 08:00:00 via INTRAVENOUS

## 2018-08-27 MED ORDER — DIPHENHYDRAMINE HCL 25 MG PO CAPS
ORAL_CAPSULE | ORAL | Status: AC
  Filled 2018-08-27: qty 1

## 2018-08-27 MED ORDER — TAMSULOSIN HCL 0.4 MG PO CAPS: 0.4000 mg | ORAL_CAPSULE | Freq: Every day | ORAL | 0 refills | Status: AC

## 2018-08-27 MED ORDER — CIPROFLOXACIN HCL 500 MG PO TABS
ORAL_TABLET | ORAL | Status: AC
  Filled 2018-08-27: qty 1

## 2018-08-27 MED ORDER — DIAZEPAM 5 MG PO TABS
10.0000 mg | ORAL_TABLET | ORAL | Status: AC
  Administered 2018-08-27: 10 mg via ORAL

## 2018-08-27 MED ORDER — HYDROCODONE-ACETAMINOPHEN 5-325 MG PO TABS: 1.0000 | ORAL_TABLET | ORAL | Status: DC | PRN

## 2018-08-27 MED ORDER — DIPHENHYDRAMINE HCL 25 MG PO CAPS
25.0000 mg | ORAL_CAPSULE | ORAL | Status: AC
  Administered 2018-08-27: 25 mg via ORAL

## 2018-08-27 MED ORDER — IBUPROFEN 800 MG PO TABS: 800.0000 mg | ORAL_TABLET | Freq: Three times a day (TID) | ORAL | 0 refills | Status: AC | PRN

## 2018-08-27 MED ORDER — ONDANSETRON HCL 4 MG/2ML IJ SOLN
INTRAMUSCULAR | Status: AC
  Filled 2018-08-27: qty 2

## 2018-08-27 MED ORDER — DIAZEPAM 5 MG PO TABS
ORAL_TABLET | ORAL | Status: AC
  Filled 2018-08-27: qty 2

## 2018-08-27 NOTE — H&P (Signed)
UROLOGY H&P UPDATE  Agree with prior H&P dated 08/21/2018  Cardiac: RRR Lungs: CTA bilaterally  Laterality: RIGHT Procedure: RIGHT SWL  Urine: Culture negative 08/20/2018  32 yo F with RIGHT 1.3cm proximal ureteral stone, 850 HU, 12cm SSD, clearly seen on KUB today  Informed consent obtained, we specifically discussed the risk of bleeding/renal hematoma, infection/sepsis, steinstrasse, and need for additional procedures.  Sondra Come, MD 08/27/2018

## 2018-08-27 NOTE — Discharge Instructions (Signed)
CIRUGIA AMBULATORIA       Instruccionnes de alta       1.  Las drogas que se Dispensing optician en su cuerpo [employer], asi            que por las proximas 24 horas usted no debe:   Conducir Field seismologist) un automovil   Hacer ninguna decision legal   Tomar ninguna bebida alcoholica  2.  A) [place] puede comenzar una dieta regular.  Es mejor que hoy empiece con           liquidos y gradualmente anada [address]       B) Puede comer cualquier comida que desee pero es mejor empezar con liquidos,                      luego sopitas con galletas saladas y gradualmente llegar a las comidas solidas.  3.  Por favor avise a su medico inmediatamente si usted tiene algun sangrado anormal,       tiene dificultad con la respiracion, enrojecimiento y Engineer, mining en el sitio de la cirugia, [place],       fiebro o dolor que se alivia con [place].  4.  FOLLOW [facility] DISCHARGE INSTRUCTION SHEET AS REVIEWED.

## 2018-08-31 ENCOUNTER — Encounter: Payer: Self-pay | Admitting: Urology

## 2018-09-10 ENCOUNTER — Ambulatory Visit: Payer: Self-pay | Admitting: Urology

## 2018-09-14 ENCOUNTER — Encounter: Payer: Self-pay | Admitting: Urology

## 2020-02-10 IMAGING — CR DG ABDOMEN 1V
2 series · 2 of 2 positions shown · non-contrast
Comparison: 08/22/2018

CLINICAL DATA: Right lithotripsy

EXAM:
ABDOMEN - 1 VIEW

[abdomen kub (1 of 2)]
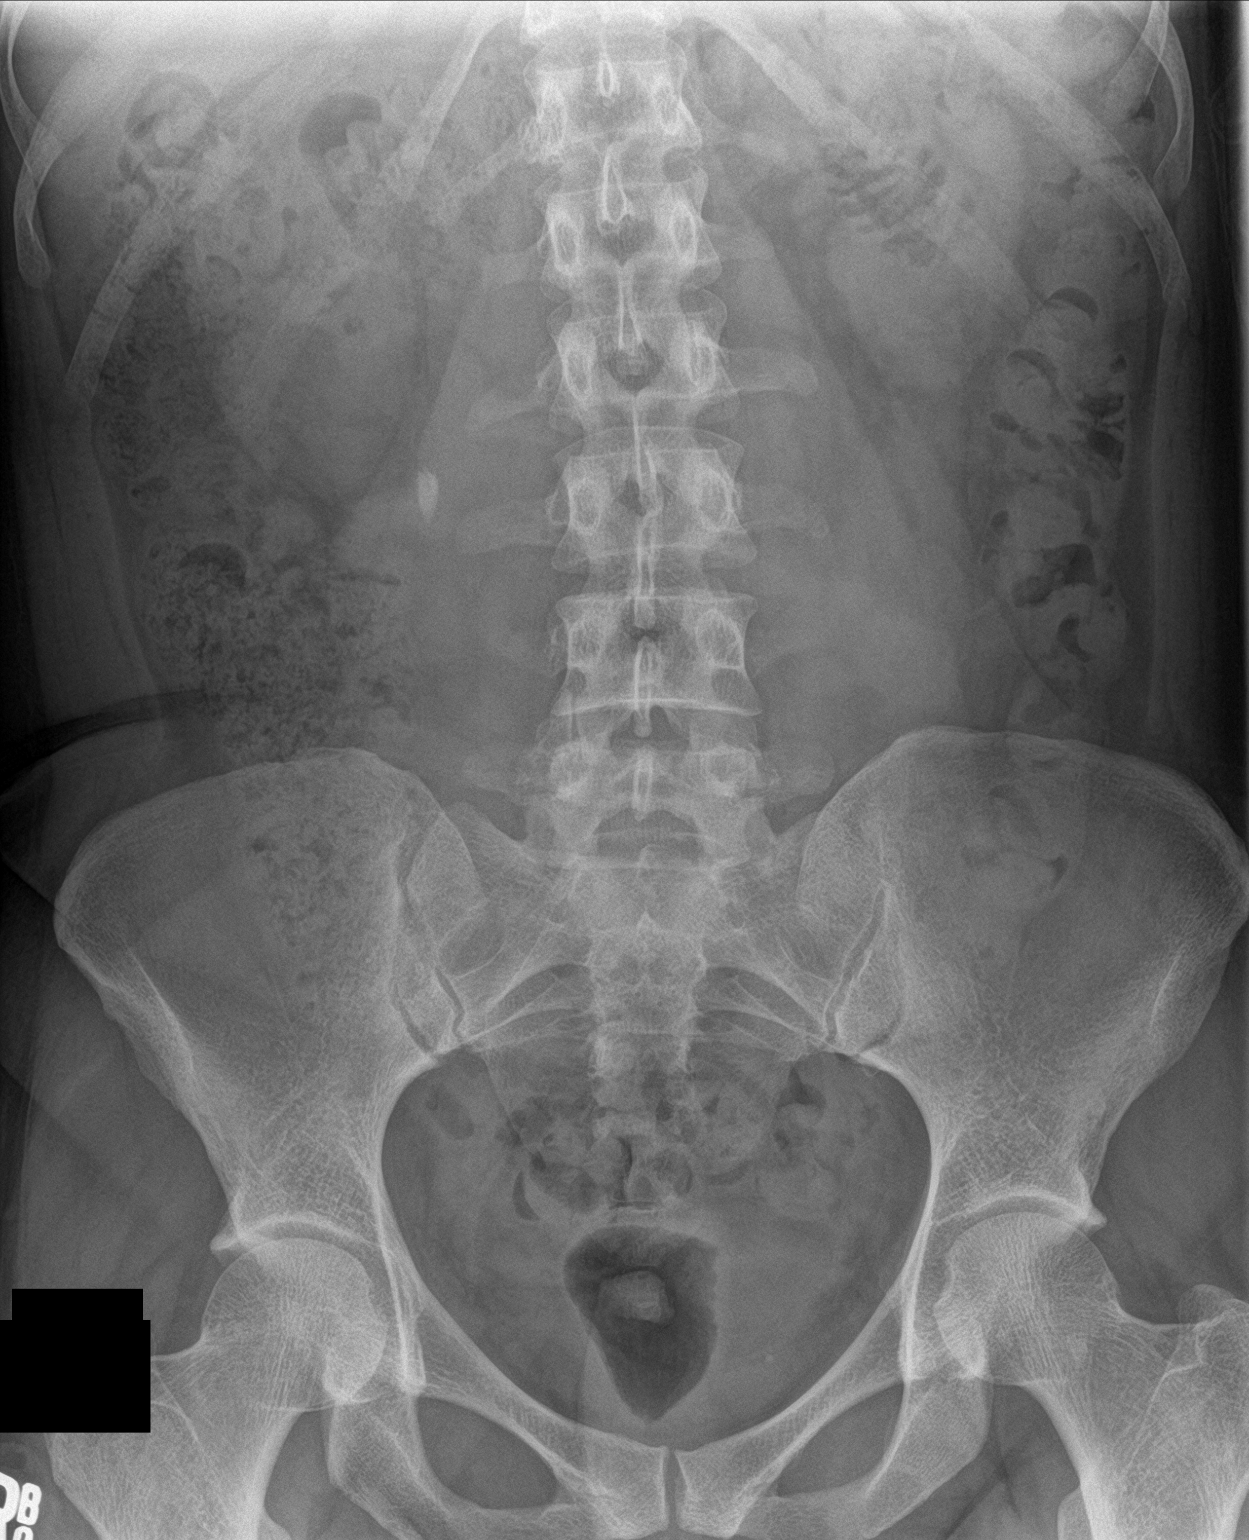

[abdomen kub (2 of 2)]
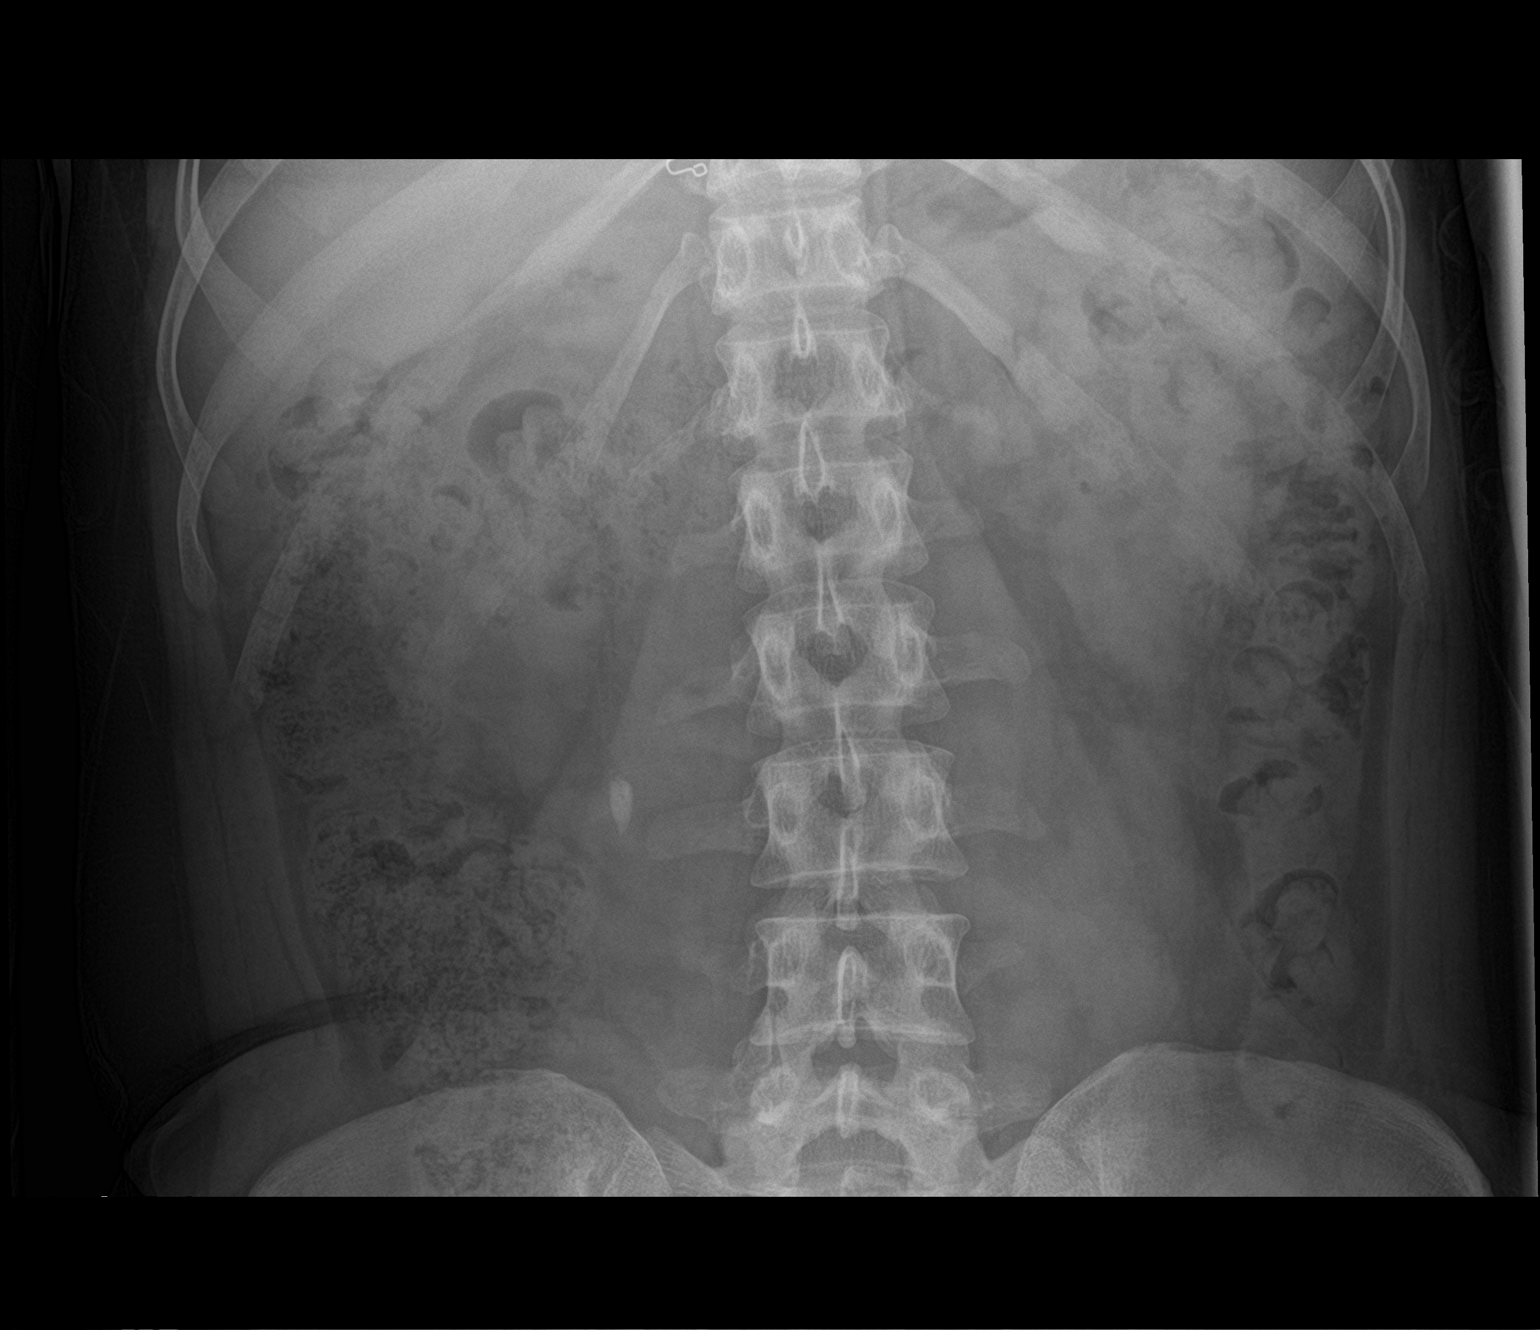

[2 of 2 positions shown; findings below may reference images not displayed]

FINDINGS: 13 mm mid right ureteral stone again noted, unchanged in location
since prior CT. Moderate stool burden in the colon. No organomegaly
or free air.
IMPRESSION: 13 mm mid right ureteral stone again noted.

Moderate stool burden.
